# Patient Record
Sex: Male | Born: 2000 | Race: White | Hispanic: No | Marital: Single
Health system: Southern US, Community
[De-identification: ages and names within clinical notes are randomized; demographics above are authoritative.]

## PROBLEM LIST (undated history)

## (undated) VITALS — BP 106/66 | HR 65 | Temp 97.9°F | Resp 15 | Ht 62.4 in | Wt 115.7 lb

## (undated) VITALS — BP 98/63 | HR 121 | Temp 97.9°F | Resp 18 | Ht 63.07 in | Wt 119.0 lb

## (undated) DIAGNOSIS — F902 Attention-deficit hyperactivity disorder, combined type: Secondary | ICD-10-CM

## (undated) DIAGNOSIS — F99 Mental disorder, not otherwise specified: Secondary | ICD-10-CM

---
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
## 2023-11-08 ENCOUNTER — Other Ambulatory Visit: Payer: Self-pay

## 2023-11-08 ENCOUNTER — Emergency Department (HOSPITAL_BASED_OUTPATIENT_CLINIC_OR_DEPARTMENT_OTHER)
Admission: EM | Admit: 2023-11-08 | Discharge: 2023-11-09 | Disposition: A | Discharge status: Home / Self Care | Payer: Self-pay | Attending: Emergency Medicine | Admitting: Emergency Medicine

## 2023-11-08 ENCOUNTER — Encounter (HOSPITAL_BASED_OUTPATIENT_CLINIC_OR_DEPARTMENT_OTHER): Payer: Self-pay | Admitting: Emergency Medicine

## 2023-11-08 DIAGNOSIS — D72829 Elevated white blood cell count, unspecified: Secondary | ICD-10-CM | POA: Insufficient documentation

## 2023-11-08 DIAGNOSIS — R112 Nausea with vomiting, unspecified: Secondary | ICD-10-CM | POA: Insufficient documentation

## 2023-11-08 DIAGNOSIS — R197 Diarrhea, unspecified: Secondary | ICD-10-CM | POA: Insufficient documentation

## 2023-11-08 DIAGNOSIS — E876 Hypokalemia: Secondary | ICD-10-CM | POA: Insufficient documentation

## 2023-11-08 LAB — CBC
HCT: 46.7 % (ref 39.0–52.0)
Hemoglobin: 16.5 g/dL (ref 13.0–17.0)
MCH: 30.2 pg (ref 26.0–34.0)
MCHC: 35.3 g/dL (ref 30.0–36.0)
MCV: 85.5 fL (ref 80.0–100.0)
Platelets: 312 K/uL (ref 150–400)
RBC: 5.46 MIL/uL (ref 4.22–5.81)
RDW: 12.2 % (ref 11.5–15.5)
WBC: 12.3 K/uL — ABNORMAL HIGH (ref 4.0–10.5)
nRBC: 0 % (ref 0.0–0.2)

## 2023-11-08 LAB — URINALYSIS, ROUTINE W REFLEX MICROSCOPIC
Glucose, UA: 100 mg/dL — AB
Hgb urine dipstick: NEGATIVE
Ketones, ur: >=80 mg/dL — AB
Leukocytes,Ua: NEGATIVE
Nitrite: NEGATIVE
Protein, ur: 100 mg/dL — AB
Specific Gravity, Urine: 1.015 (ref 1.005–1.030)
pH: 8.5 — ABNORMAL HIGH (ref 5.0–8.0)

## 2023-11-08 LAB — URINALYSIS, MICROSCOPIC (REFLEX)

## 2023-11-08 LAB — CBG MONITORING, ED: Glucose-Capillary: 116 mg/dL — ABNORMAL HIGH (ref 70–99)

## 2023-11-08 LAB — COMPREHENSIVE METABOLIC PANEL WITH GFR
ALT: 13 U/L (ref 0–44)
AST: 20 U/L (ref 15–41)
Albumin: 4.9 g/dL (ref 3.5–5.0)
Alkaline Phosphatase: 95 U/L (ref 38–126)
Anion gap: 19 — ABNORMAL HIGH (ref 5–15)
BUN: 11 mg/dL (ref 6–20)
CO2: 22 mmol/L (ref 22–32)
Calcium: 9.7 mg/dL (ref 8.9–10.3)
Chloride: 100 mmol/L (ref 98–111)
Creatinine, Ser: 1.08 mg/dL (ref 0.61–1.24)
GFR, Estimated: >60 mL/min (ref >60)
Glucose, Bld: 123 mg/dL — ABNORMAL HIGH (ref 70–99)
Potassium: 3.3 mmol/L — ABNORMAL LOW (ref 3.5–5.1)
Sodium: 141 mmol/L (ref 135–145)
Total Bilirubin: 1.1 mg/dL (ref 0.0–1.2)
Total Protein: 7.7 g/dL (ref 6.5–8.1)

## 2023-11-08 LAB — LIPASE, BLOOD: Lipase: 15 U/L (ref 11–51)

## 2023-11-08 MED ORDER — PROCHLORPERAZINE EDISYLATE 10 MG/2ML IJ SOLN
10.0000 mg | Freq: Once | INTRAMUSCULAR | Status: AC
  Administered 2023-11-09: 10 mg via INTRAVENOUS
  Filled 2023-11-08: qty 2

## 2023-11-08 MED ORDER — LOPERAMIDE HCL 2 MG PO CAPS
4.0000 mg | ORAL_CAPSULE | Freq: Once | ORAL | Status: AC
  Administered 2023-11-09: 4 mg via ORAL
  Filled 2023-11-08: qty 2

## 2023-11-08 MED ORDER — SODIUM CHLORIDE 0.9 % IV BOLUS
1000.0000 mL | Freq: Once | INTRAVENOUS | Status: AC
  Administered 2023-11-09: 1000 mL via INTRAVENOUS

## 2023-11-08 MED ORDER — ONDANSETRON HCL 4 MG/2ML IJ SOLN
4.0000 mg | Freq: Once | INTRAMUSCULAR | Status: DC
  Filled 2023-11-08: qty 2

## 2023-11-08 NOTE — ED Provider Notes (Signed)
 [place] [facility] Provider Note   CSN: [id number] Arrival date & time: 11/08/23  2145     Patient presents with: Emesis and Diarrhea   Joshua [name] is a 23 y.o. male.  {Add pertinent medical, surgical, social history, OB history to YEP:67052} The history is provided by the patient.  Emesis Associated symptoms: diarrhea   Diarrhea Associated symptoms: vomiting    He has history of attention deficit disorder and comes in complaining of vomiting and diarrhea for the last 3 days.  He has had subjective fever but no chills or sweats.  He is feeling weak and dizzy.  He states there has been occasional slight streaks of blood in his emesis.  He denies any sick contacts.    Prior to Admission medications   Medication Sig Start Date End Date Taking? Authorizing Provider  buPROPion  (WELLBUTRIN  XL) 300 MG 24 hr tablet Take 1 tablet (300 mg total) by mouth daily. 11/04/13   Tadepalli, Gayathri D, MD  folic acid  (FOLVITE ) 1 MG tablet Take 1 mg by mouth daily.    [provider]  hydrOXYzine  (ATARAX /VISTARIL ) 25 MG tablet Take 1 tablet (25 mg total) by mouth at bedtime. 11/04/13   Tadepalli, Gayathri D, MD  lisdexamfetamine (VYVANSE ) 40 MG capsule Take 1 capsule (40 mg total) by mouth daily after breakfast. 11/04/13   Tadepalli, Gayathri D, MD    Allergies: Patient has no known allergies.    Review of Systems  Gastrointestinal:  Positive for diarrhea and vomiting.  All other systems reviewed and are negative.   Updated Vital Signs BP 126/82   Pulse 85   Temp 98.3 F (36.8 C) (Oral)   Resp 16   Ht 5' 7 (1.702 m)   Wt 59 kg   SpO2 100%   BMI 20.36 kg/m   Physical Exam Vitals and nursing note reviewed.   23 year old male, resting comfortably and in no acute distress. Vital signs are normal. Oxygen saturation is 100%, which is normal. Head is normocephalic and atraumatic. PERRLA, EOMI.  Lungs are clear without rales, wheezes, or  rhonchi. Chest is nontender. Heart has regular rate and rhythm without murmur. Abdomen is soft, flat, nontender. Skin is pale, warm and dry without rash. Neurologic: Mental status is normal, cranial nerves are intact, moves all extremities equally.  (all labs ordered are listed, but only abnormal results are displayed) Labs Reviewed  COMPREHENSIVE METABOLIC PANEL WITH GFR - Abnormal; Notable for the following components:      Result Value   Potassium 3.3 (*)    Glucose, Bld 123 (*)    Anion gap 19 (*)    All other components within normal limits  CBC - Abnormal; Notable for the following components:   WBC 12.3 (*)    All other components within normal limits  URINALYSIS, ROUTINE W REFLEX MICROSCOPIC - Abnormal; Notable for the following components:   APPearance CLOUDY (*)    pH 8.5 (*)    Glucose, UA 100 (*)    Bilirubin Urine MODERATE (*)    Ketones, ur >=80 (*)    Protein, ur 100 (*)    All other components within normal limits  URINALYSIS, MICROSCOPIC (REFLEX) - Abnormal; Notable for the following components:   Bacteria, UA RARE (*)    All other components within normal limits  CBG MONITORING, ED - Abnormal; Notable for the following components:   Glucose-Capillary 116 (*)    All other components within normal limits  LIPASE, BLOOD    EKG: None  Radiology: No results found.  {Document cardiac monitor, telemetry assessment procedure when appropriate:32947} Procedures   Medications Ordered in the ED  ondansetron [facility]) injection 4 mg (has no administration in time range)      {Click here for ABCD2, HEART and other calculators REFRESH Note before signing:1}                              Medical Decision Making Amount and/or Complexity of Data Reviewed Labs: ordered.  Risk Prescription drug management.   Nausea, vomiting, diarrhea in a pattern most suggestive of viral gastroenteritis.  Consider food poisoning.  Doubt serious intra-abdominal pathology such as  diverticulitis or bowel obstruction.  I have reviewed his laboratory tests, and my interpretation is mild hypokalemia likely secondary to GI losses, elevated random glucose which will need to be followed as an outpatient, elevated anion gap likely secondary to vomiting and diarrhea, mild leukocytosis which is nonspecific.  Urinalysis significant for glucose and ketones as well as protein but no evidence of UTI.  He had a dose of ondansetron but still is having nausea.  I have ordered a dose of prochlorperazine, loperamide and also I have ordered IV fluids and oral potassium.  {Document critical care time when appropriate  Document review of labs and clinical decision tools ie CHADS2VASC2, etc  Document your independent review of radiology images and any outside records  Document your discussion with family members, caretakers and with consultants  Document social determinants of health affecting pt's care  Document your decision making why or why not admission, treatments were needed:32947:::1}   Final diagnoses:  None    ED Discharge Orders     None

## 2023-11-08 NOTE — ED Triage Notes (Signed)
 Pt c/o n/v/d x 3 days, worse today.  Pt c/o dizziness, lethargy.

## 2023-11-09 MED ORDER — METOCLOPRAMIDE HCL 5 MG/ML IJ SOLN
10.0000 mg | Freq: Once | INTRAMUSCULAR | Status: AC
  Administered 2023-11-09: 10 mg via INTRAVENOUS
  Filled 2023-11-09: qty 2

## 2023-11-09 MED ORDER — POTASSIUM CHLORIDE CRYS ER 20 MEQ PO TBCR: 20.0000 meq | EXTENDED_RELEASE_TABLET | Freq: Two times a day (BID) | ORAL | 0 refills | Status: AC

## 2023-11-09 MED ORDER — ONDANSETRON 4 MG PO TBDP: 4.0000 mg | ORAL_TABLET | Freq: Three times a day (TID) | ORAL | 0 refills | Status: AC | PRN

## 2023-11-09 MED ORDER — SODIUM CHLORIDE 0.9 % IV BOLUS
1000.0000 mL | Freq: Once | INTRAVENOUS | Status: AC
  Administered 2023-11-09: 1000 mL via INTRAVENOUS

## 2023-11-09 NOTE — Discharge Instructions (Addendum)
Take loperamide (Imodium A-D) as needed for diarrhea.  Return if symptoms are not being adequately controlled at home.
